# Patient Record
Sex: Female | Born: 2000 | Race: White | Hispanic: No | Marital: Single | Smoking: Never smoker
Health system: Southern US, Community
[De-identification: ages and names within clinical notes are randomized; demographics above are authoritative.]

## PROBLEM LIST (undated history)

## (undated) DIAGNOSIS — S0291XA Unspecified fracture of skull, initial encounter for closed fracture: Secondary | ICD-10-CM

## (undated) DIAGNOSIS — S060X9A Concussion with loss of consciousness of unspecified duration, initial encounter: Secondary | ICD-10-CM

## (undated) DIAGNOSIS — F419 Anxiety disorder, unspecified: Principal | ICD-10-CM

## (undated) DIAGNOSIS — F909 Attention-deficit hyperactivity disorder, unspecified type: Secondary | ICD-10-CM

## (undated) DIAGNOSIS — S62109A Fracture of unspecified carpal bone, unspecified wrist, initial encounter for closed fracture: Secondary | ICD-10-CM

## (undated) DIAGNOSIS — N390 Urinary tract infection, site not specified: Secondary | ICD-10-CM

## (undated) HISTORY — DX: Fracture of unspecified carpal bone, unspecified wrist, initial encounter for closed fracture: S62.109A

## (undated) HISTORY — DX: Attention-deficit hyperactivity disorder, unspecified type: F90.9

## (undated) HISTORY — PX: WISDOM TOOTH EXTRACTION: SHX21

## (undated) HISTORY — DX: Concussion with loss of consciousness of unspecified duration, initial encounter: S06.0X9A

## (undated) HISTORY — DX: Anxiety disorder, unspecified: F41.9

## (undated) HISTORY — DX: Unspecified fracture of skull, initial encounter for closed fracture: S02.91XA

## (undated) HISTORY — PX: INTRAUTERINE DEVICE (IUD) INSERTION: SHX5877

## (undated) HISTORY — DX: Urinary tract infection, site not specified: N39.0

---
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
## 2019-01-09 ENCOUNTER — Other Ambulatory Visit: Payer: Self-pay

## 2019-01-09 DIAGNOSIS — Z20822 Contact with and (suspected) exposure to covid-19: Secondary | ICD-10-CM

## 2019-01-10 ENCOUNTER — Ambulatory Visit: Payer: BC Managed Care – PPO | Admitting: Pediatrics

## 2019-01-10 LAB — NOVEL CORONAVIRUS, NAA: SARS-CoV-2, NAA: NOT DETECTED

## 2019-01-17 ENCOUNTER — Telehealth: Payer: Self-pay | Admitting: Obstetrics & Gynecology

## 2019-01-22 ENCOUNTER — Encounter: Payer: Self-pay | Admitting: Obstetrics & Gynecology

## 2019-01-22 ENCOUNTER — Telehealth (INDEPENDENT_AMBULATORY_CARE_PROVIDER_SITE_OTHER): Payer: BC Managed Care – PPO | Admitting: Obstetrics & Gynecology

## 2019-01-22 DIAGNOSIS — Z3009 Encounter for other general counseling and advice on contraception: Secondary | ICD-10-CM

## 2019-01-22 DIAGNOSIS — N946 Dysmenorrhea, unspecified: Secondary | ICD-10-CM

## 2019-01-22 NOTE — Progress Notes (Signed)
Virtual Visit via Video Note  I connected with Hannah Alvarado on 01/22/19 at 11:30 AM EST by a video enabled telemedicine application and verified that I am speaking with the correct person using two identifiers.  Location: Patient: home Provider: office   I discussed the limitations of evaluation and management by telemedicine and the availability of in person appointments. The patient expressed understanding and agreed to proceed.  History of Present Illness: 18 y.o. G0P0000 Single White or Caucasian female here to discuss birth control options.  She has been consider using an IUD for [place] term option.  Has three suite-mates who all have IUDs.  Has been using continuous active OCPs for cycle control and to help with dysmenorrhea.  Also considering this for contraception options.  Is anxious about the political climate and the possiblity of not having this option going forward.  Just wants to review options.  Mother was present for video conferencing but only after I spoke with pt initially and pt was comfortable with mother's presence on video call.  IUD options, risks, benefits and side effects discussed.  Placement discussed.  Decreased risks due to no estrogen reviewed.  Uterine peforation and surgical correctoin, imbedded IUD and common cramping/spotting discussed with pt.  All questions answered.    Patient Active Problem List   Diagnosis Date Noted  . Attention disturbance 06/05/2018  . Immunization due 07/28/2017  . Sleep disorder 06/07/2016  . 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 Medication management 03/23/2016  . Post concussion syndrome 10/29/2015  . Head injury 10/23/2015  . Anxiety 11/28/2014  . Panic attack 11/28/2014  . Attention deficit hyperactivity disorder (ADHD), combined type 09/20/2013  . BMI (body mass index), pediatric, 5% to less than 85% for age 37/19/2014    Past Medical History:  Diagnosis Date  . ADHD (attention deficit hyperactivity disorder)   . Anxiety  11/28/2014  . Broken wrist 10/2005   right wrist  . Concussion   . Concussion 10/23/2015  . Neonatal hyperbilirubinemia    peak bili 20.9  . Skull fracture (HCC) 10/2015  . UTI (lower urinary tract infection) 02/21/12   cystitis, E. Coli    Past Surgical History:  Procedure Laterality Date  . WISDOM TOOTH EXTRACTION      MEDS:   Current Outpatient Medications on File Prior to Visit  Medication Sig Dispense Refill  . amphetamine-dextroamphetamine (ADDERALL XR) 20 MG 24 hr capsule Take 1 capsule (20 mg total) by mouth daily for 30 days. 30 capsule 0  . amphetamine-dextroamphetamine (ADDERALL XR) 20 MG 24 hr capsule Take 1 capsule (20 mg total) by mouth daily for 30 days. 30 capsule 0  . amphetamine-dextroamphetamine (ADDERALL XR) 20 MG 24 hr capsule Take 1 capsule (20 mg total) by mouth daily. 30 capsule 0  . [START ON 01/27/2019] amphetamine-dextroamphetamine (ADDERALL XR) 20 MG 24 hr capsule Take 1 capsule (20 mg total) by mouth daily. 30 capsule 0  . [START ON 02/26/2019] amphetamine-dextroamphetamine (ADDERALL XR) 20 MG 24 hr capsule Take 1 capsule (20 mg total) by mouth daily. 30 capsule 0  . amphetamine-dextroamphetamine (ADDERALL) 10 MG tablet Take 1 tablet (10 mg total) by mouth daily for 30 days. 30 tablet 0  . amphetamine-dextroamphetamine (ADDERALL) 10 MG tablet Take 1 tablet (10 mg total) by mouth daily for 30 days. 30 tablet 0  . amphetamine-dextroamphetamine (ADDERALL) 10 MG tablet Take 1 tablet (10 mg total) by mouth daily for 30 days. 30 tablet 0  . cloNIDine (CATAPRES) 0.1 MG tablet TAKE 1 TABLET (  0.1 MG TOTAL) BY MOUTH AT BEDTIME. 30 tablet 3  . norethindrone-ethinyl estradiol-iron (LARIN FE 1.5/30) 1.5-30 MG-MCG tablet TAKE 1 TABLET BY MOUTH ONCE DAILY SKIPPING PLACEBO TABLETS. 4 Package 4   No current facility-administered medications on file prior to visit.     ALLERGIES: Gabapentin  Family History  Problem Relation Age of Onset  . Diabetes Father   . Diabetes  Maternal Grandmother   . Hypertension Maternal Grandmother   . Cancer Paternal Grandfather        leukemia  . Diabetes Paternal Grandfather   . Heart disease Paternal Grandfather        and renal disease-due to medications  . Migraines Paternal Grandfather   . Depression Mother   . ADD / ADHD Sister   . Autism Sister   . ADD / ADHD Brother     SH:  Single, non smoker   Observations/Objective: WNWD WF, NAD  Assessment and Plan: Contraception discussion H/o dysmenorrhea Considering chang from OCPs to [place] IUD.  Follow Up Instructions: She and mother will discuss further and she will let me know if desires to have IUD placement done.  For now, will stay on continuous active OCPs.    The patient knows to call back or seek an in-person evaluation if feels this is appropriate for her.    I provided 20 minutes of non-face-to-face time during this encounter.   Megan Salon, MD

## 2019-01-26 ENCOUNTER — Other Ambulatory Visit: Payer: Self-pay

## 2019-01-26 ENCOUNTER — Ambulatory Visit (INDEPENDENT_AMBULATORY_CARE_PROVIDER_SITE_OTHER): Payer: BC Managed Care – PPO | Admitting: Pediatrics

## 2019-01-26 ENCOUNTER — Encounter: Payer: Self-pay | Admitting: Pediatrics

## 2019-01-26 VITALS — BP 120/80 | Ht 65.5 in | Wt 126.2 lb

## 2019-01-26 DIAGNOSIS — Z1331 Encounter for screening for depression: Secondary | ICD-10-CM

## 2019-01-26 DIAGNOSIS — Z0000 Encounter for general adult medical examination without abnormal findings: Secondary | ICD-10-CM

## 2019-01-26 DIAGNOSIS — Z68.41 Body mass index (BMI) pediatric, 5th percentile to less than 85th percentile for age: Secondary | ICD-10-CM | POA: Diagnosis not present

## 2019-01-26 DIAGNOSIS — Z00129 Encounter for routine child health examination without abnormal findings: Secondary | ICD-10-CM

## 2019-01-26 MED ORDER — AMPHETAMINE-DEXTROAMPHET ER 20 MG PO CP24: 20.0000 mg | ORAL_CAPSULE | Freq: Every day | ORAL | 0 refills | Status: DC

## 2019-01-26 NOTE — Patient Instructions (Signed)
Preventive Care 19-19 Years Old, Female Preventive care refers to lifestyle choices and visits with your health care provider that can promote health and wellness. At this stage in your life, you may start seeing a primary care physician instead of a pediatrician. Your health care is now your responsibility. Preventive care for young adults includes:  A yearly physical exam. This is also called an annual wellness visit.  Regular dental and eye exams.  Immunizations.  Screening for certain conditions.  Healthy lifestyle choices, such as diet and exercise. What can I expect for my preventive care visit? Physical exam Your health care provider may check:  Height and weight. These may be used to calculate body mass index (BMI), which is a measurement that tells if you are at a healthy weight.  Heart rate and blood pressure.  Body temperature. Counseling Your health care provider may ask you questions about:  Past medical problems and family medical history.  Alcohol, tobacco, and drug use.  Home and relationship well-being.  Access to firearms.  Emotional well-being.  Diet, exercise, and sleep habits.  Sexual activity and sexual health.  Method of birth control.  Menstrual cycle.  Pregnancy history. What immunizations do I need?  Influenza (flu) vaccine  This is recommended every year. Tetanus, diphtheria, and pertussis (Tdap) vaccine  You may need a Td booster every 10 years. Varicella (chickenpox) vaccine  You may need this vaccine if you have not already been vaccinated. Human papillomavirus (HPV) vaccine  If recommended by your health care provider, you may need three doses over 6 months. Measles, mumps, and rubella (MMR) vaccine  You may need at least one dose of MMR. You may also need a second dose. Meningococcal conjugate (MenACWY) vaccine  One dose is recommended if you are 19-19 years old and a first-year college student living in a residence hall,  or if you have one of several medical conditions. You may also need additional booster doses. Pneumococcal conjugate (PCV13) vaccine  You may need this if you have certain conditions and were not previously vaccinated. Pneumococcal polysaccharide (PPSV23) vaccine  You may need one or two doses if you smoke cigarettes or if you have certain conditions. Hepatitis A vaccine  You may need this if you have certain conditions or if you travel or work in places where you may be exposed to hepatitis A. Hepatitis B vaccine  You may need this if you have certain conditions or if you travel or work in places where you may be exposed to hepatitis B. Haemophilus influenzae type b (Hib) vaccine  You may need this if you have certain risk factors. You may receive vaccines as individual doses or as more than one vaccine together in one shot (combination vaccines). Talk with your health care provider about the risks and benefits of combination vaccines. What tests do I need? Blood tests  Lipid and cholesterol levels. These may be checked every 5 years starting at age 19.  Hepatitis C test.  Hepatitis B test. Screening  Pelvic exam and Pap test. This may be done every 3 years starting at age 19.  Sexually transmitted disease (STD) testing, if you are at risk.  BRCA-related cancer screening. This may be done if you have a family history of breast, ovarian, tubal, or peritoneal cancers. Other tests  Tuberculosis skin test.  Vision and hearing tests.  Skin exam.  Breast exam. Follow these instructions at home: Eating and drinking   Eat a diet that includes fresh fruits and   grains, lean protein, and low-fat dairy products.  Drink enough fluid to keep your urine pale yellow.  Do not drink alcohol if: ? Your health care provider tells you not to drink. ? You are pregnant, may be pregnant, or are planning to become pregnant. ? You are under the legal drinking age. In the [place]  the legal drinking age is 45.  If you drink alcohol: ? Limit how much you have to 0-1 drink a day. ? Be aware of how much alcohol is in your drink. In the [place] one drink equals one 12 oz bottle of beer (355 mL), one 5 oz glass of wine (148 mL), or one 1 oz glass of hard liquor (44 mL). Lifestyle  Take daily care of your teeth and gums.  Stay active. Exercise at least 30 minutes 5 or more days of the week.  Do not use any products that contain nicotine or tobacco, such as cigarettes, e-cigarettes, and chewing tobacco. If you need help quitting, ask your health care provider.  Do not use drugs.  If you are sexually active, practice safe sex. Use a condom or other form of birth control (contraception) in order to prevent pregnancy and STIs (sexually transmitted infections). If you plan to become pregnant, see your health care provider for a pre-conception visit.  Find healthy ways to cope with stress, such as: ? Meditation, yoga, or listening to music. ? Journaling. ? Talking to a trusted person. ? Spending time with friends and family. Safety  Always wear your seat belt while driving or riding in a vehicle.  Do not drive if you have been drinking alcohol. Do not ride with someone who has been drinking.  Do not drive when you are tired or distracted. Do not text while driving.  Wear a helmet and other protective equipment during sports activities.  If you have firearms in your house, make sure you follow all gun safety procedures.  Seek help if you have been bullied, physically abused, or sexually abused.  Use the Internet responsibly to avoid dangers such as online bullying and online sex predators. What's next?  Go to your health care provider once a year for a well check visit.  Ask your health care provider how often you should have your eyes and teeth checked.  Stay up to date on all vaccines. This information is not intended to replace advice given to you by your  health care provider. Make sure you discuss any questions you have with your health care provider. Document Released: 07/24/2015 Document Revised: 03/02/2018 Document Reviewed: 03/02/2018 Elsevier Patient Education  2020 [employer].

## 2019-01-26 NOTE — Progress Notes (Signed)
Routine Well-Adolescent Visit   PCP: Leveda Anna, NP   History was provided by the patient and mother.  Hannah Alvarado is a 18 y.o. female who is here for 18 year well check.   Current concerns: restarted having sleeping issues, will wake up several times at night.    Adolescent Assessment:  Confidentiality was discussed with the patient and if applicable, with caregiver as well.  Home and Environment:  Lives with: lives at home with parents Parental relations: good Friends/Peers: yes Nutrition/Eating Behaviors: balanced, meats, fruits, vegetables, milk/calcium Sports/Exercise:  none  Education and Employment:  School Status: in college in online and on campus classes and is doing very well School History: School attendance is regular. 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 Yes  Smoking: no Secondhand smoke exposure? no Drugs/EtOH: none   Sexuality:  -Menarche: currently on OCP for period regulation - females:  last menses: N/A- constant cycling on OCP - Menstrual History: with severe dysmenorrhea  - Sexually active? no  - sexual partners in last year: No immediate complications noted. - contraception use: abstinence - Last STI Screening: NA  - Violence/Abuse: none  Mood: Suicidality and Depression: denies Weapons: none  Screenings: The patient completed the Rapid Assessment for Adolescent Preventive Services screening questionnaire and the following topics were identified as risk factors and discussed: none  In addition, the following topics were discussed as part of anticipatory guidance healthy eating, exercise, seatbelt use, bullying, abuse/trauma, weapon use, tobacco use, marijuana use, drug use, condom use, birth control, suicidality/self harm, mental health issues and social isolation.  PHQ-9  completed and results indicated no concerns  Physical Exam:  BP 120/80   Ht 5' 5.5" (1.664 m)   Wt 126 lb 3.2 oz (57.2 kg)   BMI 20.68 kg/m  Blood pressure percentiles are not available for patients who are 18 years or older.  General Appearance:   alert, oriented, no acute distress and well nourished  HENT: Normocephalic, no obvious abnormality, PERRL, EOM's intact, conjunctiva clear  Mouth:   Normal appearing teeth, no obvious discoloration, dental caries, or dental caps  Neck:   Supple; thyroid: no enlargement, symmetric, no tenderness/mass/nodules  Lungs:   Clear to auscultation bilaterally, normal work of breathing  Heart:   Regular rate and rhythm, S1 and S2 normal, no murmurs;   Abdomen:   Soft, non-tender, no mass, or organomegaly  GU genitalia not examined  Musculoskeletal:   Tone and strength strong and symmetrical, all extremities               Lymphatic:   No cervical adenopathy  Skin/Hair/Nails:   Skin warm, dry and intact, no rashes, no bruises or petechiae  Neurologic:   Strength, gait, and coordination normal and age-appropriate    Assessment/Plan:  BMI: is appropriate for age  Immunizations today: per orders. History of previous adverse reactions to immunizations? no Counseling completed for all of the vaccine components. No orders of the defined types were placed in this encounter.   Darrell Jewel, NP

## 2019-03-19 ENCOUNTER — Telehealth: Payer: Self-pay | Admitting: Obstetrics & Gynecology

## 2019-03-19 DIAGNOSIS — Z3009 Encounter for other general counseling and advice on contraception: Secondary | ICD-10-CM

## 2019-03-19 MED FILL — LARIN FE 1.5-30 TABLET: 1.5-30 | 84 days supply | Qty: 112 | Fill #2

## 2019-03-19 NOTE — Telephone Encounter (Signed)
Spoke to pt. Pt wanting to get [facility] IUD insertion since talking with Dr Sabra Heck on [facility] visit on 01/22/19. Pt requested OV before Jan 14th before moving back to college. Pt scheduled for IUD insertion on 03/29/2019 at 8am. Pt agreeable. Pt instructed to take Motrin 800 mg with food and water one hour before procedure and to continue to take OCPs to appt. Pt verbalized understanding.   Will route to Dr Sabra Heck for review and will close encounter.   CC: Weston Brass for precert. Orders placed.

## 2019-03-19 NOTE — Telephone Encounter (Signed)
Patient calling to schedule [name] IUD insertion.

## 2019-03-20 ENCOUNTER — Telehealth: Payer: Self-pay | Admitting: Obstetrics & Gynecology

## 2019-03-20 NOTE — Telephone Encounter (Signed)
Call placed to convey benefits for [name]. 

## 2019-03-20 NOTE — Telephone Encounter (Signed)
Spoke with the patient and conveyed benefits for Hannah Alvarado. Patient understands/agreeable with the benefits. Patient is aware of the cancellation policy. Appointment scheduled 03/29/19.

## 2019-03-23 DIAGNOSIS — Z975 Presence of (intrauterine) contraceptive device: Secondary | ICD-10-CM | POA: Insufficient documentation

## 2019-03-23 HISTORY — DX: Presence of (intrauterine) contraceptive device: Z97.5

## 2019-03-26 ENCOUNTER — Other Ambulatory Visit: Payer: Self-pay

## 2019-03-27 ENCOUNTER — Encounter: Payer: Self-pay | Admitting: Family Medicine

## 2019-03-27 ENCOUNTER — Ambulatory Visit: Payer: BC Managed Care – PPO | Admitting: Family Medicine

## 2019-03-27 VITALS — BP 109/74 | HR 109 | Temp 98.6°F | Resp 16 | Ht 65.25 in | Wt 126.4 lb

## 2019-03-27 DIAGNOSIS — F419 Anxiety disorder, unspecified: Secondary | ICD-10-CM | POA: Diagnosis not present

## 2019-03-27 DIAGNOSIS — Z7689 Persons encountering health services in other specified circumstances: Secondary | ICD-10-CM

## 2019-03-27 DIAGNOSIS — G479 Sleep disorder, unspecified: Secondary | ICD-10-CM | POA: Diagnosis not present

## 2019-03-27 DIAGNOSIS — F902 Attention-deficit hyperactivity disorder, combined type: Secondary | ICD-10-CM | POA: Diagnosis not present

## 2019-03-27 MED ORDER — AMPHETAMINE-DEXTROAMPHET ER 20 MG PO CP24: 20.0000 mg | ORAL_CAPSULE | Freq: Every day | ORAL | 0 refills | Status: DC

## 2019-03-27 NOTE — Patient Instructions (Addendum)
I have filled your medications for you.  Follow up in 3 months.    Please help [place] help you:  We are honored you have chosen [facility] for your Primary Care home. Below you will find basic instructions that you may need to access in the future. Please help [place] help you by reading the instructions, which cover many of the frequent questions we experience.   Prescription refills and request:  -In order to allow more efficient response time, please call your pharmacy for all refills. They will forward the request electronically to [place]. This allows for the quickest possible response. Request left on a nurse line can take longer to refill, since these are checked as time allows between office patients and other phone calls.  - refill request can take up to 3-5 working days to complete.  - If request is sent electronically and request is appropiate, it is usually completed in 1-2 business days.  - all patients will need to be seen routinely for all chronic medical conditions requiring prescription medications (see follow-up below). If you are overdue for follow up on your condition, you will be asked to make an appointment and we will call in enough medication to cover you until your appointment (up to 30 days).  - all controlled substances will require a face to face visit to request/refill.  - if you desire your prescriptions to go through a new pharmacy, and have an active script at original pharmacy, you will need to call your pharmacy and have scripts transferred to new pharmacy. This is completed between the pharmacy locations and not by your provider.    Results: If any images or labs were ordered, it can take up to 1 week to get results depending on the test ordered and the lab/facility running and resulting the test. - Normal or stable results, which do not need further discussion, may be released to your mychart immediately with attached note to you. A call may not be generated for normal  results. Please make certain to sign up for mychart. If you have questions on how to activate your mychart you can call the front office.  - If your results need further discussion, our office will attempt to contact you via phone, and if unable to reach you after 2 attempts, we will release your abnormal result to your mychart with instructions.  - All results will be automatically released in mychart after 1 week.  - Your provider will provide you with explanation and instruction on all relevant material in your results. Please keep in mind, results and labs may appear confusing or abnormal to the untrained eye, but it does not mean they are actually abnormal for you personally. If you have any questions about your results that are not covered, or you desire more detailed explanation than what was provided, you should make an appointment with your provider to do so.   Our office handles many outgoing and incoming calls daily. If we have not contacted you within 1 week about your results, please check your mychart to see if there is a message first and if not, then contact our office.  In helping with this matter, you help decrease call volume, and therefore allow [place] to be able to respond to patients needs more efficiently.   Acute office visits (sick visit):  An acute visit is intended for a new problem and are scheduled in shorter time slots to allow schedule openings for patients with new problems.  This is the appropriate visit to discuss a new problem. Problems will not be addressed by phone call or Echart message. Appointment is needed if requesting treatment. In order to provide you with excellent quality medical care with proper time for you to explain your problem, have an exam and receive treatment with instructions, these appointments should be limited to one new problem per visit. If you experience a new problem, in which you desire to be addressed, please make an acute office visit, we save  openings on the schedule to accommodate you. Please do not save your new problem for any other type of visit, let us take care of it properly and quickly for you.   Follow up visits:  Depending on your condition(s) your provider will need to see you routinely in order to provide you with quality care and prescribe medication(s). Most chronic conditions (Example: hypertension, Diabetes, depression/anxiety... etc), require visits a couple times a year. Your provider will instruct you on proper follow up for your personal medical conditions and history. Please make certain to make follow up appointments for your condition as instructed. Failing to do so could result in lapse in your medication treatment/refills. If you request a refill, and are overdue to be seen on a condition, we will always provide you with a 30 day script (once) to allow you time to schedule.    Medicare wellness (well visit): - we have a wonderful Nurse Maudie Mercury), that will meet with you and provide you will yearly medicare wellness visits. These visits should occur yearly (can not be scheduled less than 1 calendar year apart) and cover preventive health, immunizations, advance directives and screenings you are entitled to yearly through your medicare benefits. Do not miss out on your entitled benefits, this is when medicare will pay for these benefits to be ordered for you.  These are strongly encouraged by your provider and is the appropriate type of visit to make certain you are up to date with all preventive health benefits. If you have not had your medicare wellness exam in the last 12 months, please make certain to schedule one by calling the office and schedule your medicare wellness with Maudie Mercury as soon as possible.   Yearly physical (well visit):  - Adults are recommended to be seen yearly for physicals. Check with your insurance and date of your last physical, most insurances require one calendar year between physicals. Physicals  include all preventive health topics, screenings, medical exam and labs that are appropriate for gender/age and history. You may have fasting labs needed at this visit. This is a well visit (not a sick visit), new problems should not be covered during this visit (see acute visit).  - Pediatric patients are seen more frequently when they are younger. Your provider will advise you on well child visit timing that is appropriate for your their age. - This is not a medicare wellness visit. Medicare wellness exams do not have an exam portion to the visit. Some medicare companies allow for a physical, some do not allow a yearly physical. If your medicare allows a yearly physical you can schedule the medicare wellness with our nurse Maudie Mercury and have your physical with your provider after, on the same day. Please check with insurance for your full benefits.   Late Policy/No Shows:  - all new patients should arrive 15-30 minutes earlier than appointment to allow [place] time  to  obtain all personal demographics,  insurance information and for you to complete office  paperwork. - All established patients should arrive 10-15 minutes earlier than appointment time to update all information and be checked in .  - In our best efforts to run on time, if you are late for your appointment you will be asked to either reschedule or if able, we will work you back into the schedule. There will be a wait time to work you back in the schedule,  depending on availability.  - If you are unable to make it to your appointment as scheduled, please call 24 hours ahead of time to allow [place] to fill the time slot with someone else who needs to be seen. If you do not cancel your appointment ahead of time, you may be charged a no show fee.
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
## 2019-03-29 ENCOUNTER — Ambulatory Visit (INDEPENDENT_AMBULATORY_CARE_PROVIDER_SITE_OTHER): Payer: BC Managed Care – PPO | Admitting: Obstetrics & Gynecology

## 2019-03-29 ENCOUNTER — Encounter: Payer: Self-pay | Admitting: Obstetrics & Gynecology

## 2019-03-29 ENCOUNTER — Other Ambulatory Visit: Payer: Self-pay

## 2019-03-29 VITALS — BP 110/58 | HR 88 | Temp 98.1°F | Resp 12 | Wt 128.0 lb

## 2019-03-29 DIAGNOSIS — Z01812 Encounter for preprocedural laboratory examination: Secondary | ICD-10-CM | POA: Diagnosis not present

## 2019-03-29 DIAGNOSIS — Z3009 Encounter for other general counseling and advice on contraception: Secondary | ICD-10-CM

## 2019-03-29 DIAGNOSIS — Z30430 Encounter for insertion of intrauterine contraceptive device: Secondary | ICD-10-CM

## 2019-03-29 LAB — POCT URINE PREGNANCY: Preg Test, Ur: NEGATIVE

## 2019-03-29 NOTE — Patient Instructions (Signed)
IUD Post-procedure Instructions . Cramping is common.  You may take Ibuprofen, Aleve, or Tylenol for the cramping.  A good dosage is 400mg  of ibuprofen and 500mg  tylenol together and take this up to every 4 hours as needed.  This typically resolves within 48 hours.   . You may have a small amount of spotting.  You should wear a mini pad for the next few days. . You may have intercourse in 24 hours. . You need to call the office if you have any pelvic pain, fever, heavy bleeding, or foul smelling vaginal discharge. . Shower or bathe as normal
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
## 2019-03-30 LAB — PAIN MGMT, PROFILE 8 W/CONF, U
6 Acetylmorphine: NEGATIVE ng/mL
Alcohol Metabolites: POSITIVE ng/mL — AB (ref <500)
Amphetamine: 1680 ng/mL
Amphetamines: POSITIVE ng/mL
Benzodiazepines: NEGATIVE ng/mL
Buprenorphine, Urine: NEGATIVE ng/mL
Cocaine Metabolite: NEGATIVE ng/mL
Creatinine: 109.1 mg/dL
Ethyl Glucuronide (ETG): 5554 ng/mL
Ethyl Sulfate (ETS): 379 ng/mL
MDMA: NEGATIVE ng/mL
Marijuana Metabolite: NEGATIVE ng/mL
Methamphetamine: NEGATIVE ng/mL
Opiates: NEGATIVE ng/mL
Oxidant: NEGATIVE ug/mL
Oxycodone: NEGATIVE ng/mL
pH: 6.8 (ref 4.5–9.0)

## 2019-04-02 ENCOUNTER — Encounter: Payer: Self-pay | Admitting: Family Medicine

## 2019-04-25 ENCOUNTER — Other Ambulatory Visit (INDEPENDENT_AMBULATORY_CARE_PROVIDER_SITE_OTHER): Payer: Self-pay | Admitting: Pediatrics

## 2019-05-17 ENCOUNTER — Other Ambulatory Visit (INDEPENDENT_AMBULATORY_CARE_PROVIDER_SITE_OTHER): Payer: Self-pay | Admitting: Pediatrics

## 2019-05-25 ENCOUNTER — Other Ambulatory Visit: Payer: Self-pay

## 2019-05-25 ENCOUNTER — Telehealth (INDEPENDENT_AMBULATORY_CARE_PROVIDER_SITE_OTHER): Payer: BC Managed Care – PPO | Admitting: Pediatrics

## 2019-05-25 ENCOUNTER — Encounter (INDEPENDENT_AMBULATORY_CARE_PROVIDER_SITE_OTHER): Payer: Self-pay | Admitting: Pediatrics

## 2019-05-25 VITALS — Ht 65.0 in | Wt 126.0 lb

## 2019-05-25 DIAGNOSIS — G479 Sleep disorder, unspecified: Secondary | ICD-10-CM | POA: Diagnosis not present

## 2019-05-25 MED ORDER — CLONIDINE HCL 0.1 MG PO TABS: 0.2000 mg | ORAL_TABLET | Freq: Every day | ORAL | 2 refills | Status: DC

## 2019-05-25 NOTE — Patient Instructions (Signed)
Sleep Tips for Adolescents  The following recommendations will help you get the best sleep possible and make it easier for you to fall asleep and stay asleep:  Sleep schedule. Wake up and go to bed at about the same time on school nights and non-school nights. Bedtime and wake time should not differ from one day to the next by more than an hour or so. Weekends. Don't sleep in on weekends to "catch up" on sleep. This makes it more likely that you will have problems falling asleep at bedtime.  Naps. If you are very sleepy during the day, nap for 30 to 45 minutes in the early afternoon. Don't nap too [place] or too late in the afternoon or you will have difficulty falling asleep at bedtime.  Sunlight. Spend time outside every day, especially in the morning, as exposure to sunlight, or bright light, helps to keep your body's internal clock on track.  Exercise. Exercise regularly. Exercising may help you fall asleep and sleep more deeply.  Bedroom. Make sure your bedroom is comfortable, quiet, and dark. Make sure also that it is not too warm at night, as sleeping in a room warmer than 75P will make it hard to sleep.  Bed. Use your bed only for sleeping. Don't study, read, or listen to music on your bed.  Bedtime. Make the 30 to 60 minutes before bedtime a quiet or wind-down time. Relaxing, calm, enjoyable activities, such as reading a book or listening to soothing music, help your body and mind slow down enough to let you sleep. Do not watch TV, study, exercise, or get involved in "energizing" activities in the 30 minutes before bedtime. Snack. Eat regular meals and don't go to bed hungry. A light snack before bed is a good idea; eating a full meal in the hour before bed is not.  Caffeine. A void eating or drinking products containing caffeine in the late afternoon and evening. These include caffeinated sodas, coffee, tea, and chocolate.  Alcohol. Ingestion of alcohol disrupts sleep and may cause you to  awaken throughout the night.  Smoking. Smoking disturbs sleep. Don't smoke for at least an hour before bedtime (and preferably, not at all).  Sleeping pills. Don't use sleeping pills, melatonin, or other over-the-counter sleep aids. These may be dangerous, and your sleep problems will probably return when you stop using the medicine.   Mindell JA & Owens JA (2003). A Clinical Guide to Pediatric Sleep: Diagnosis and Management of Sleep Problems. [place]: [employer].   Supported by an educational grant from [employer]  

## 2019-05-25 NOTE — Progress Notes (Signed)
Patient: Hannah Alvarado MRN: [id number] Sex: female DOB: 01-13-2001  Provider: Carylon Perches, MD  This is a Pediatric Specialist E-Visit follow up consult provided via WebEx.  Staci Acosta Dolinsky consented to an E-Visit consult today.  Location of patient: Hannah Alvarado is at home Location of provider: Marden Noble is at office Patient was referred by Ma Hillock, DO   The following participants were involved in this E-Visit: Sabino Niemann, CMA      Carylon Perches, MD  Chief Complain/ Reason for E-Visit today: Routine Follow-Up  History of Present Illness:  Hannah Alvarado is a 19 y.o. female with history of sleep disorder who I am seeing for routine follow-up. Patient was last seen on 09/08/2018  Where clonidine was continued.   Patient presents today by herself.  She reports sleep is going well.  She requests more clonidine.  She took some time to adjust to college.  She falls asleep well, but sometimes wakes up and has to take another dose.   Goes to bed at 11pm-12am, wakes at 8:30-10am.  Weekends are pretty consistent.  Wakes up about 2-3 times per night, falls asleep quickly.  However reports tired during the day.    Endorses college has been stressful.  She is taking adderall during the day 20mg  without problem.     Past Medical History Past Medical History:  Diagnosis Date  . ADHD (attention deficit hyperactivity disorder)   . Anxiety 11/28/2014  . Broken wrist 10/2005   right wrist  . Concussion 10/23/2015  . IUD (intrauterine device) in place 03/2019  . Neonatal hyperbilirubinemia    peak bili 20.9  . Skull fracture ([place]) 10/2015  . UTI (lower urinary tract infection) 02/21/12   cystitis, E. Coli    Surgical History Past Surgical History:  Procedure Laterality Date  . WISDOM TOOTH EXTRACTION      Family History family history includes ADD / ADHD in her brother and sister; Autism in her sister; Breast cancer in her paternal grandmother;  Cancer in her paternal grandfather; Depression in her mother and sister; Diabetes in her father, maternal grandmother, and paternal grandfather; Heart disease in her paternal grandfather; Hypertension in her father and maternal grandmother; Learning disabilities in her brother; Migraines in her paternal grandfather; Miscarriages / [place] in her maternal grandmother and paternal grandmother; Throat cancer in her father.   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  Otherwise lives with parents.  Has a brother and a sister.   youth group at church.   Safety:      -Wears a bicycle helmet riding a bike: Yes     -smoke alarm in the home:Yes     - wears seatbelt: Yes     - Feels safe in their relationships: Yes      Rivermead (06/07/2016): 8   Rivermead (08/09/2016): 13   Rivermead (02/02/2017): 7             Allergies Allergies  Allergen Reactions  . Gabapentin Rash    Medications Current Outpatient Medications on File Prior to Visit  Medication Sig Dispense Refill  . Levonorgestrel 19.5 MG IUD by Intrauterine route.     No current facility-administered medications on file prior to visit.   The medication list was reviewed and reconciled. All changes or newly prescribed medications were explained.  A complete medication list was provided to the patient/caregiver.  Physical Exam Vitals deferred due to  webex visit Gen: well appearing teen Skin: No rash, No neurocutaneous stigmata. HEENT: Normocephalic, no dysmorphic features, no conjunctival injection, nares patent, mucous membranes moist, oropharynx clear. Resp: normal work of breathing OE:CXFQHKU well perfused  Neurological Examination: MS: Awake, alert, interactive. Normal eye contact, answered the questions appropriately for age, speech was fluent,  Normal comprehension.  Attention and concentration were normal. Cranial Nerves: EOM normal,  no nystagmus; no ptsosis, face symmetric with full strength of facial muscles, hearing grossly intact.  Motor/Coordination- At least antigravity in upper extremities.. No abnormal movements.   Diagnosis:  1. Sleep disorder       Assessment and Plan Hannah Alvarado is a 19 y.o. female with history of sleep disorder who I am seeing in follow-up. Patient doing well falling asleep, but having trouble with frequent arousals.  Is having increased anxiety which may contribute to sleep difficulty, so discussed seeking counseling with college health.  However will increase clonidine given good repsonse previously.    Refill Clonidine, increase to 2 tablets nightly for now.  May reduce back to 1 tablet if sleep improves, however counseled on not stopping abruptly.   Sleep hygeine tips provided.    Return in about 4 months (around 09/24/2019).  Lorenz Coaster MD MPH Neurology and Neurodevelopment [facility]  [address] [place], [place], [place] [address] Phone: [phone]   Total time on call: 12 minutes

## 2019-06-18 ENCOUNTER — Ambulatory Visit (INDEPENDENT_AMBULATORY_CARE_PROVIDER_SITE_OTHER): Payer: BC Managed Care – PPO | Admitting: Family Medicine

## 2019-06-18 ENCOUNTER — Encounter: Payer: Self-pay | Admitting: Family Medicine

## 2019-06-18 ENCOUNTER — Other Ambulatory Visit: Payer: Self-pay

## 2019-06-18 VITALS — Temp 97.7°F | Ht 65.25 in

## 2019-06-18 DIAGNOSIS — F902 Attention-deficit hyperactivity disorder, combined type: Secondary | ICD-10-CM | POA: Diagnosis not present

## 2019-06-18 DIAGNOSIS — F419 Anxiety disorder, unspecified: Secondary | ICD-10-CM

## 2019-06-18 DIAGNOSIS — G479 Sleep disorder, unspecified: Secondary | ICD-10-CM | POA: Diagnosis not present

## 2019-06-18 MED ORDER — AMPHETAMINE-DEXTROAMPHET ER 20 MG PO CP24: 20.0000 mg | ORAL_CAPSULE | Freq: Every day | ORAL | 0 refills | Status: DC
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
## 2019-10-23 ENCOUNTER — Ambulatory Visit: Payer: BC Managed Care – PPO | Admitting: Obstetrics & Gynecology
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
## 2019-11-05 ENCOUNTER — Encounter: Payer: Self-pay | Admitting: Obstetrics & Gynecology

## 2019-11-05 ENCOUNTER — Other Ambulatory Visit: Payer: Self-pay

## 2019-11-05 ENCOUNTER — Ambulatory Visit: Payer: BC Managed Care – PPO | Admitting: Obstetrics & Gynecology

## 2019-11-05 VITALS — BP 108/70 | HR 68 | Resp 16 | Ht 65.5 in | Wt 141.0 lb

## 2019-11-05 DIAGNOSIS — Z01419 Encounter for gynecological examination (general) (routine) without abnormal findings: Secondary | ICD-10-CM

## 2020-02-11 ENCOUNTER — Other Ambulatory Visit (INDEPENDENT_AMBULATORY_CARE_PROVIDER_SITE_OTHER): Payer: Self-pay | Admitting: Pediatrics

## 2020-11-12 DIAGNOSIS — Z20822 Contact with and (suspected) exposure to covid-19: Secondary | ICD-10-CM | POA: Diagnosis not present

## 2021-02-09 ENCOUNTER — Other Ambulatory Visit (HOSPITAL_COMMUNITY)
Admission: RE | Admit: 2021-02-09 | Discharge: 2021-02-09 | Disposition: A | Discharge status: Home / Self Care | Payer: BC Managed Care – PPO | Source: Ambulatory Visit | Attending: Obstetrics & Gynecology | Admitting: Obstetrics & Gynecology

## 2021-02-09 ENCOUNTER — Encounter (HOSPITAL_BASED_OUTPATIENT_CLINIC_OR_DEPARTMENT_OTHER): Payer: Self-pay | Admitting: Obstetrics & Gynecology

## 2021-02-09 ENCOUNTER — Other Ambulatory Visit: Payer: Self-pay

## 2021-02-09 ENCOUNTER — Ambulatory Visit (INDEPENDENT_AMBULATORY_CARE_PROVIDER_SITE_OTHER): Payer: BC Managed Care – PPO | Admitting: Obstetrics & Gynecology

## 2021-02-09 VITALS — BP 114/72 | HR 85 | Ht 65.0 in | Wt 166.2 lb

## 2021-02-09 DIAGNOSIS — Z124 Encounter for screening for malignant neoplasm of cervix: Secondary | ICD-10-CM

## 2021-02-09 DIAGNOSIS — Z975 Presence of (intrauterine) contraceptive device: Secondary | ICD-10-CM | POA: Diagnosis not present

## 2021-02-09 DIAGNOSIS — F0781 Postconcussional syndrome: Secondary | ICD-10-CM

## 2021-02-09 DIAGNOSIS — Z01419 Encounter for gynecological examination (general) (routine) without abnormal findings: Secondary | ICD-10-CM | POA: Diagnosis not present

## 2021-02-09 DIAGNOSIS — F902 Attention-deficit hyperactivity disorder, combined type: Secondary | ICD-10-CM

## 2021-02-09 NOTE — Progress Notes (Signed)
20 y.o. G0P0000 Single White or Caucasian female here for annual exam.  Doing well.  Graduating next month from [employer] with degree in communications.  Not SA.  Has Kyleen IUD in 2021.  Cycles are typically monthly.  This past month it was just spotting but that is unusual.  Rutha Bouchard was placed 03/2019.    No LMP recorded. (Menstrual status: IUD).          Sexually active: No.  The current method of family planning is IUD.    Exercising: Yes.     running Smoker:  no  Health Maintenance: Pap:  today History of abnormal Pap:  n/a MMG:  guidelines reviewed Colonoscopy:  guidelines reviewed Screening Labs: not indicated   reports that she has never smoked. She has never used smokeless tobacco. She reports that she does not drink alcohol and does not use drugs.  Past Medical History:  Diagnosis Date   ADHD (attention deficit hyperactivity disorder)    Anxiety 11/28/2014   Broken wrist 10/2005   right wrist   Concussion 10/23/2015   IUD (intrauterine device) in place 03/2019   Neonatal hyperbilirubinemia    peak bili 20.9   Skull fracture (HCC) 10/2015   UTI (lower urinary tract infection) 02/21/12   cystitis, E. Coli    Past Surgical History:  Procedure Laterality Date   INTRAUTERINE DEVICE (IUD) INSERTION     kyleena inserted 03-29-2019   WISDOM TOOTH EXTRACTION      Current Outpatient Medications  Medication Sig Dispense Refill   levonorgestrel ([name]) 19.5 MG IUD by Intrauterine route once.     cloNIDine (CATAPRES) 0.1 MG tablet TAKE 2 TABLETS (0.2 MG TOTAL) BY MOUTH AT BEDTIME. (Patient not taking: Reported on 02/09/2021) 180 tablet 2   No current facility-administered medications for this visit.    Family History  Problem Relation Age of Onset   Diabetes Father    Throat cancer Father    Hypertension Father    Diabetes Maternal Grandmother    Hypertension Maternal Grandmother    Miscarriages / Stillbirths Maternal Grandmother    Cancer Paternal Grandfather         leukemia   Diabetes Paternal Grandfather    Heart disease Paternal Grandfather        and renal disease-due to medications   Migraines Paternal Grandfather    Depression Mother    ADD / ADHD Sister    Autism Sister    Depression Sister    ADD / ADHD Brother    Learning disabilities Brother    Breast cancer Paternal Grandmother    Miscarriages / Stillbirths Paternal Grandmother     Review of Systems  All other systems reviewed and are negative.  Exam:   BP 114/72 (BP Location: Left Arm, Patient Position: Sitting, Cuff Size: Normal)   Pulse 85   Ht 5\' 5"  (1.651 m) Comment: reported  Wt 166 lb 3.2 oz (75.4 kg)   BMI 27.66 kg/m   Height: 5\' 5"  (165.1 cm) (reported)  General appearance: alert, cooperative and appears stated age Head: Normocephalic, without obvious abnormality, atraumatic Neck: no adenopathy, supple, symmetrical, trachea midline and thyroid normal to inspection and palpation Lungs: clear to auscultation bilaterally Breasts: normal appearance, no masses or tenderness Heart: regular rate and rhythm Abdomen: soft, non-tender; bowel sounds normal; no masses,  no organomegaly Extremities: extremities normal, atraumatic, no cyanosis or edema Skin: Skin color, texture, turgor normal. No rashes or lesions Lymph nodes: Cervical, supraclavicular, and axillary nodes normal. No abnormal inguinal nodes  palpated Neurologic: Grossly normal   Pelvic: External genitalia:  no lesions              Urethra:  normal appearing urethra with no masses, tenderness or lesions              Bartholins and Skenes: normal                 Vagina: normal appearing vagina with normal color and no discharge, no lesions              Cervix: no lesions, IUD string noted              Pap taken: Yes.   Bimanual Exam:  Uterus:  normal size, contour, position, consistency, mobility, non-tender              Adnexa: normal adnexa               Rectovaginal: Confirms               Anus:  normal  sphincter tone, no lesions  Chaperone, Ina Homes, CMA, was present for exam.  Assessment/Plan: 1. Well woman exam with routine gynecological exam - pap smear obtained today - breast and colon cancer screening guidelines reviewed - no lab work obtained - care gaps reviewed/updated  2. IUD (intrauterine device) in place - [name] placed 03/2019  3. Attention deficit hyperactivity disorder (ADHD), combined type  4. Post concussion syndrome

## 2021-02-16 LAB — CYTOLOGY - PAP: Diagnosis: NEGATIVE

## 2021-09-25 ENCOUNTER — Encounter: Payer: BC Managed Care – PPO | Admitting: Family Medicine

## 2021-09-30 ENCOUNTER — Encounter: Payer: Self-pay | Admitting: Family Medicine

## 2021-09-30 ENCOUNTER — Ambulatory Visit (INDEPENDENT_AMBULATORY_CARE_PROVIDER_SITE_OTHER): Payer: 59 | Admitting: Family Medicine

## 2021-09-30 VITALS — BP 108/68 | HR 83 | Temp 98.2°F | Ht 65.5 in | Wt 171.0 lb

## 2021-09-30 DIAGNOSIS — Z1159 Encounter for screening for other viral diseases: Secondary | ICD-10-CM

## 2021-09-30 DIAGNOSIS — Z793 Long term (current) use of hormonal contraceptives: Secondary | ICD-10-CM

## 2021-09-30 DIAGNOSIS — Z0000 Encounter for general adult medical examination without abnormal findings: Secondary | ICD-10-CM

## 2021-09-30 DIAGNOSIS — F902 Attention-deficit hyperactivity disorder, combined type: Secondary | ICD-10-CM

## 2021-09-30 DIAGNOSIS — Z975 Presence of (intrauterine) contraceptive device: Secondary | ICD-10-CM

## 2021-09-30 LAB — CBC
HCT: 44.1 % (ref 36.0–46.0)
Hemoglobin: 14.6 g/dL (ref 12.0–15.0)
MCHC: 33.2 g/dL (ref 30.0–36.0)
MCV: 91 fl (ref 78.0–100.0)
Platelets: 339 10*3/uL (ref 150.0–400.0)
RBC: 4.84 Mil/uL (ref 3.87–5.11)
RDW: 12.8 % (ref 11.5–15.5)
WBC: 12.3 10*3/uL — ABNORMAL HIGH (ref 4.0–10.5)

## 2021-09-30 LAB — COMPREHENSIVE METABOLIC PANEL
ALT: 12 U/L (ref 0–35)
AST: 13 U/L (ref 0–37)
Albumin: 4.8 g/dL (ref 3.5–5.2)
Alkaline Phosphatase: 102 U/L (ref 39–117)
BUN: 11 mg/dL (ref 6–23)
CO2: 26 mEq/L (ref 19–32)
Calcium: 10 mg/dL (ref 8.4–10.5)
Chloride: 103 mEq/L (ref 96–112)
Creatinine, Ser: 0.69 mg/dL (ref 0.40–1.20)
GFR: 123.71 mL/min (ref >60.00)
Glucose, Bld: 75 mg/dL (ref 70–99)
Potassium: 4.2 mEq/L (ref 3.5–5.1)
Sodium: 139 mEq/L (ref 135–145)
Total Bilirubin: 0.5 mg/dL (ref 0.2–1.2)
Total Protein: 7.3 g/dL (ref 6.0–8.3)

## 2021-09-30 LAB — LDL CHOLESTEROL, DIRECT: Direct LDL: 100 mg/dL

## 2021-09-30 MED ORDER — AMPHETAMINE-DEXTROAMPHETAMINE 20 MG PO TABS: 10.0000 mg | ORAL_TABLET | Freq: Two times a day (BID) | ORAL | 0 refills | Status: DC

## 2021-09-30 NOTE — Progress Notes (Signed)
Patient ID: Hannah Alvarado, female  DOB: 17-Jan-2001, 21 y.o.   MRN: [id number] Patient Care Team    Relationship Specialty Notifications Start End  Ma Hillock, DO PCP - General Family Medicine  03/27/19 10/08/21   Reason for change: Patient Moved  Rocky Link, MD Consulting Physician Pediatric Neurology  02/02/17   Megan Salon, MD Consulting Physician Gynecology  04/02/19     Chief Complaint  Patient presents with   Annual Exam    Pt is fasting    Subjective: Hannah Alvarado is a 21 y.o.  Female  present for CPE  All past medical history, surgical history, allergies, family history, immunizations, medications and social history were updated in the electronic medical record today. All recent labs, ED visits and hospitalizations within the last year were reviewed.  Health maintenance:  Colonoscopy: No family history.  Routine screening at 74 completed  Mammogram: MGM with history of breast cancer.  Uncertain age of onset.  Would encourage her to discuss with her gynecologist if she should follow routine breast cancer screenings, dependent upon her grandmother's history. Likely - routine screening.  Cervical cancer screening: Patient has [name] IUD.  Pap completed 02/09/2021 by Dr. Ninfa Meeker Immunizations: tdap UTD 2019, Influenza UTD (encouraged yearly), patient has had COVID vaccines.  Discussed by valent booster. Infectious disease screening: HIV declined Hep C completed (tattoo), urine cytology/STD screen declined. Not SA DEXA: Routine screening Assistive device: None Oxygen use: None Patient has a Dental home. Hospitalizations/ED visits: Reviewed       09/30/2021    1:07 PM 02/09/2021    9:42 AM 03/27/2019   10:33 AM 01/26/2019    4:34 PM 12/27/2017   12:33 PM  Depression screen PHQ 2/9  Decreased Interest 0 0 0 0 0  Down, Depressed, Hopeless 0 0 0 0 0  PHQ - 2 Score 0 0 0 0 0  Altered sleeping    1 0  Tired, decreased energy    0 0  Change in  appetite    0 0  Feeling bad or failure about yourself     0 0  Trouble concentrating    1 0  Moving slowly or fidgety/restless    0 0  Suicidal thoughts    0 0  PHQ-9 Score    2 0  Difficult doing work/chores    Not difficult at all Not difficult at all       No data to display                 Immunization History  Administered Date(s) Administered   DTaP 02/10/2000, 04/14/2000, 06/09/2000, 03/03/2001, 11/03/2004   Fluad Quad(high Dose 65+) 11/23/2018   HPV Quadrivalent 12/08/2012, 02/08/2013, 12/04/2013   Hepatitis A 11/03/2004, 12/14/2005   Hepatitis B 1999-12-15, 02/10/2000, 09/15/2000   HiB (PRP-OMP) 02/10/2000, 04/14/2000, 06/09/2000, 03/03/2001   IPV 02/10/2000, 04/14/2000, 09/15/2000, 11/03/2004   Influenza Nasal 12/13/2007, 11/19/2008, 12/23/2009, 12/21/2010, 11/24/2011   Influenza,Quad,Nasal, Live 12/08/2012   Influenza,inj,Quad PF,6+ Mos 11/25/2015, 11/27/2015, 12/28/2016, 12/27/2017, 11/23/2018, 12/12/2019   Influenza,inj,quad, With Preservative 01/02/2014, 12/06/2014   Influenza-Unspecified 01/26/2021   MMR 12/09/2000, 11/03/2004   Meningococcal B, OMV 07/28/2017, 08/30/2017   Meningococcal Conjugate 12/21/2010, 12/26/2015   PFIZER(Purple Top)SARS-COV-2 Vaccination 06/02/2019, 06/23/2019, 01/25/2020   Pfizer Covid-19 Vaccine Bivalent Booster 22yr & up 03/03/2021   Pneumococcal Conjugate-13 02/10/2000, 04/14/2000, 06/09/2000, 12/06/2001   Tdap 12/23/2009, 12/27/2017   Varicella 12/09/2000, 12/14/2005     Past Medical History:  Diagnosis Date  ADHD (attention deficit hyperactivity disorder)    Anosmia 05/17/2016   Anxiety 11/28/2014   Broken wrist 10/2005   right wrist   Concussion 10/23/2015   IUD (intrauterine device) in place 03/2019   Neonatal hyperbilirubinemia    peak bili 20.9   Post concussion syndrome 10/29/2015   Skull fracture ([place]) 10/2015   UTI (lower urinary tract infection) 02/21/12   cystitis, E. Coli   Allergies  Allergen Reactions    Gabapentin Rash   Past Surgical History:  Procedure Laterality Date   INTRAUTERINE DEVICE (IUD) INSERTION     kyleena inserted 03-29-2019   WISDOM TOOTH EXTRACTION     Family History  Problem Relation Age of Onset   Diabetes Father    Throat cancer Father    Hypertension Father    Diabetes Maternal Grandmother    Hypertension Maternal Grandmother    Miscarriages / Stillbirths Maternal Grandmother    Cancer Paternal Grandfather        leukemia   Diabetes Paternal Grandfather    Heart disease Paternal Grandfather        and renal disease-due to medications   Migraines Paternal Grandfather    Depression Mother    ADD / ADHD Sister    Autism Sister    Depression Sister    ADD / ADHD Brother    Learning disabilities Brother    Breast cancer Paternal Grandmother    Miscarriages / Stillbirths Paternal Grandmother    Social History   Social History Narrative      Safety:      -Wears a bicycle helmet riding a bike: Yes     -smoke alarm in the home:Yes     - wears seatbelt: Yes     - Feels safe in their relationships: Yes                   Allergies as of 09/30/2021       Reactions   Gabapentin Rash        Medication List        Accurate as of September 30, 2021  5:31 PM. If you have any questions, ask your nurse or doctor.          STOP taking these medications    cloNIDine 0.1 MG tablet Commonly known as: CATAPRES Stopped by: Howard Pouch, DO       TAKE these medications    amphetamine-dextroamphetamine 20 MG tablet Commonly known as: ADDERALL Take 0.5-1 tablets (10-20 mg total) by mouth 2 (two) times daily. Started by: Howard Pouch, DO   [name] 19.5 MG IUD Generic drug: levonorgestrel by Intrauterine route once.        All past medical history, surgical history, allergies, family history, immunizations andmedications were updated in the EMR today and reviewed under the history and medication portions of their EMR.     Recent Results  (from the past 2160 hour(s))  Comprehensive metabolic panel     Status: None   Collection Time: 09/30/21  1:31 PM  Result Value Ref Range   Sodium 139 135 - 145 mEq/L   Potassium 4.2 3.5 - 5.1 mEq/L   Chloride 103 96 - 112 mEq/L   CO2 26 19 - 32 mEq/L   Glucose, Bld 75 70 - 99 mg/dL   BUN 11 6 - 23 mg/dL   Creatinine, Ser 0.69 0.40 - 1.20 mg/dL   Total Bilirubin 0.5 0.2 - 1.2 mg/dL   Alkaline Phosphatase 102 39 - 117 U/L  AST 13 0 - 37 U/L   ALT 12 0 - 35 U/L   Total Protein 7.3 6.0 - 8.3 g/dL   Albumin 4.8 3.5 - 5.2 g/dL   GFR 123.71 >60.00 mL/min    Comment: Calculated using the CKD-EPI Creatinine Equation (2021)   Calcium 10.0 8.4 - 10.5 mg/dL  CBC     Status: Abnormal   Collection Time: 09/30/21  1:31 PM  Result Value Ref Range   WBC 12.3 (H) 4.0 - 10.5 K/uL   RBC 4.84 3.87 - 5.11 Mil/uL   Platelets 339.0 150.0 - 400.0 K/uL   Hemoglobin 14.6 12.0 - 15.0 g/dL   HCT 44.1 36.0 - 46.0 %   MCV 91.0 78.0 - 100.0 fl   MCHC 33.2 30.0 - 36.0 g/dL   RDW 12.8 11.5 - 15.5 %  Direct LDL     Status: None   Collection Time: 09/30/21  1:31 PM  Result Value Ref Range   Direct LDL 100.0 mg/dL    Comment: Optimal:  <100 mg/dLNear or Above Optimal:  100-129 mg/dLBorderline High:  130-159 mg/dLHigh:  160-189 mg/dLVery High:  >190 mg/dL    No results found.   ROS 14 pt review of systems performed and negative (unless mentioned in an HPI)  Objective: BP 108/68   Pulse 83   Temp 98.2 F (36.8 C) (Oral)   Ht 5' 5.5" (1.664 m)   Wt 171 lb (77.6 kg)   SpO2 96%   BMI 28.02 kg/m  Physical Exam Vitals and nursing note reviewed.  Constitutional:      General: She is not in acute distress.    Appearance: Normal appearance. She is not ill-appearing or toxic-appearing.  HENT:     Head: Normocephalic and atraumatic.     Right Ear: Tympanic membrane, ear canal and external ear normal. There is no impacted cerumen.     Left Ear: Tympanic membrane, ear canal and external ear normal.  There is no impacted cerumen.     Nose: No congestion or rhinorrhea.     Mouth/Throat:     Mouth: Mucous membranes are moist.     Pharynx: Oropharynx is clear. No oropharyngeal exudate or posterior oropharyngeal erythema.  Eyes:     General: No scleral icterus.       Right eye: No discharge.        Left eye: No discharge.     Extraocular Movements: Extraocular movements intact.     Conjunctiva/sclera: Conjunctivae normal.     Pupils: Pupils are equal, round, and reactive to light.  Cardiovascular:     Rate and Rhythm: Normal rate and regular rhythm.     Pulses: Normal pulses.     Heart sounds: Normal heart sounds. No murmur heard.    No friction rub. No gallop.  Pulmonary:     Effort: Pulmonary effort is normal. No respiratory distress.     Breath sounds: Normal breath sounds. No stridor. No wheezing, rhonchi or rales.  Chest:     Chest wall: No tenderness.  Abdominal:     General: Abdomen is flat. Bowel sounds are normal. There is no distension.     Palpations: Abdomen is soft. There is no mass.     Tenderness: There is no abdominal tenderness. There is no right CVA tenderness, left CVA tenderness, guarding or rebound.     Hernia: No hernia is present.  Musculoskeletal:        General: No swelling, tenderness or deformity. Normal range of motion.  Cervical back: Normal range of motion and neck supple. No rigidity or tenderness.     Right lower leg: No edema.     Left lower leg: No edema.  Lymphadenopathy:     Cervical: No cervical adenopathy.  Skin:    General: Skin is warm and dry.     Coloration: Skin is not jaundiced or pale.     Findings: No bruising, erythema, lesion or rash.  Neurological:     General: No focal deficit present.     Mental Status: She is alert and oriented to person, place, and time. Mental status is at baseline.     Cranial Nerves: No cranial nerve deficit.     Sensory: No sensory deficit.     Motor: No weakness.     Coordination: Coordination  normal.     Gait: Gait normal.     Deep Tendon Reflexes: Reflexes normal.  Psychiatric:        Mood and Affect: Mood normal.        Behavior: Behavior normal.        Thought Content: Thought content normal.        Judgment: Judgment normal.      No results found.  Assessment/plan: Jeralynn Vaquera Nuzum is a 21 y.o. female present for CPE  IUD (intrauterine device) in place/[place] term current use of hormonal contraceptive - Comprehensive metabolic panel - CBC - Direct LDL  Attention deficit hyperactivity disorder (ADHD), combined type Patient would like to restart her Adderall.  She is concerned all the upcoming changes with her move will cause her focus to lack again. [place] controlled substance database reviewed today and appropriate. Restart Adderall.  She had been described Adderall 20 mg XR in the past. We will restart short acting Adderall 20 mg in which she can taper.   Adderall 10-20 mg twice daily as needed.  Need for hepatitis C screening test - Hepatitis C Antibody Routine general medical examination at a health care facility Colonoscopy: No family history.  Routine screening at 59 completed  Mammogram: MGM with history of breast cancer.  Uncertain age of onset.  Would encourage her to discuss with her gynecologist if she should follow routine breast cancer screenings, dependent upon her grandmother's history. Likely - routine screening.  Cervical cancer screening: Patient has [name] IUD.  Pap completed 02/09/2021 by Dr. Ninfa Meeker Immunizations: tdap UTD 2019, Influenza UTD (encouraged yearly), patient has had COVID vaccines.  Discussed by valent booster. Infectious disease screening: HIV declined Hep C completed (tattoo), urine cytology/STD screen declined. Not SA DEXA: Routine screening Patient was encouraged to exercise greater than 150 minutes a week. Patient was encouraged to choose a diet filled with fresh fruits and vegetables, and lean meats. AVS provided  to patient today for education/recommendation on gender specific health and safety maintenance.  Pt moving to [place].    Orders Placed This Encounter  Procedures   Comprehensive metabolic panel   CBC   Hepatitis C Antibody   Direct LDL   Meds ordered this encounter  Medications   amphetamine-dextroamphetamine (ADDERALL) 20 MG tablet    Sig: Take 0.5-1 tablets (10-20 mg total) by mouth 2 (two) times daily.    Dispense:  60 tablet    Refill:  0   Referral Orders  No referral(s) requested today     Electronically signed by: Howard Pouch, [place]

## 2021-09-30 NOTE — Patient Instructions (Addendum)
No follow-ups on file.        Great to see you today.  I have refilled the medication(s) we provide.   If labs were collected, we will inform you of lab results once received either by echart message or telephone call.   - echart message- for normal results that have been seen by the patient already.   - telephone call: abnormal results or if patient has not viewed results in their echart.  Health Maintenance, Female Adopting a healthy lifestyle and getting preventive care are important in promoting health and wellness. Ask your health care provider about: The right schedule for you to have regular tests and exams. Things you can do on your own to prevent diseases and keep yourself healthy. What should I know about diet, weight, and exercise? Eat a healthy diet  Eat a diet that includes plenty of vegetables, fruits, low-fat dairy products, and lean protein. Do not eat a lot of foods that are high in solid fats, added sugars, or sodium. Maintain a healthy weight Body mass index (BMI) is used to identify weight problems. It estimates body fat based on height and weight. Your health care provider can help determine your BMI and help you achieve or maintain a healthy weight. Get regular exercise Get regular exercise. This is one of the most important things you can do for your health. Most adults should: Exercise for at least 150 minutes each week. The exercise should increase your heart rate and make you sweat (moderate-intensity exercise). Do strengthening exercises at least twice a week. This is in addition to the moderate-intensity exercise. Spend less time sitting. Even light physical activity can be beneficial. Watch cholesterol and blood lipids Have your blood tested for lipids and cholesterol at 22 years of age, then have this test every 5 years. Have your cholesterol levels checked more often if: Your lipid or cholesterol levels are high. You are older than 22 years of  age. You are at high risk for heart disease. What should I know about cancer screening? Depending on your health history and family history, you may need to have cancer screening at various ages. This may include screening for: Breast cancer. Cervical cancer. Colorectal cancer. Skin cancer. Lung cancer. What should I know about heart disease, diabetes, and high blood pressure? Blood pressure and heart disease High blood pressure causes heart disease and increases the risk of stroke. This is more likely to develop in people who have high blood pressure readings or are overweight. Have your blood pressure checked: Every 3-5 years if you are 18-39 years of age. Every year if you are 40 years old or older. Diabetes Have regular diabetes screenings. This checks your fasting blood sugar level. Have the screening done: Once every three years after age 40 if you are at a normal weight and have a low risk for diabetes. More often and at a younger age if you are overweight or have a high risk for diabetes. What should I know about preventing infection? Hepatitis B If you have a higher risk for hepatitis B, you should be screened for this virus. Talk with your health care provider to find out if you are at risk for hepatitis B infection. Hepatitis C Testing is recommended for: Everyone born from 1945 through 1965. Anyone with known risk factors for hepatitis C. Sexually transmitted infections (STIs) Get screened for STIs, including gonorrhea and chlamydia, if: You are sexually active and are younger than 22 years of age. You are   older than 22 years of age and your health care provider tells you that you are at risk for this type of infection. Your sexual activity has changed since you were last screened, and you are at increased risk for chlamydia or gonorrhea. Ask your health care provider if you are at risk. Ask your health care provider about whether you are at high risk for HIV. Your health  care provider may recommend a prescription medicine to help prevent HIV infection. If you choose to take medicine to prevent HIV, you should first get tested for HIV. You should then be tested every 3 months for as [place] as you are taking the medicine. Pregnancy If you are about to stop having your period (premenopausal) and you may become pregnant, seek counseling before you get pregnant. Take 400 to 800 micrograms (mcg) of folic acid every day if you become pregnant. Ask for birth control (contraception) if you want to prevent pregnancy. Osteoporosis and menopause Osteoporosis is a disease in which the bones lose minerals and strength with aging. This can result in bone fractures. If you are 65 years old or older, or if you are at risk for osteoporosis and fractures, ask your health care provider if you should: Be screened for bone loss. Take a calcium or vitamin D supplement to lower your risk of fractures. Be given hormone replacement therapy (HRT) to treat symptoms of menopause. Follow these instructions at home: Alcohol use Do not drink alcohol if: Your health care provider tells you not to drink. You are pregnant, may be pregnant, or are planning to become pregnant. If you drink alcohol: Limit how much you have to: 0-1 drink a day. Know how much alcohol is in your drink. In the [place] one drink equals one 12 oz bottle of beer (355 mL), one 5 oz glass of wine (148 mL), or one 1 oz glass of hard liquor (44 mL). Lifestyle Do not use any products that contain nicotine or tobacco. These products include cigarettes, chewing tobacco, and vaping devices, such as e-cigarettes. If you need help quitting, ask your health care provider. Do not use street drugs. Do not share needles. Ask your health care provider for help if you need support or information about quitting drugs. General instructions Schedule regular health, dental, and eye exams. Stay current with your vaccines. Tell your health  care provider if: You often feel depressed. You have ever been abused or do not feel safe at home. Summary Adopting a healthy lifestyle and getting preventive care are important in promoting health and wellness. Follow your health care provider's instructions about healthy diet, exercising, and getting tested or screened for diseases. Follow your health care provider's instructions on monitoring your cholesterol and blood pressure. This information is not intended to replace advice given to you by your health care provider. Make sure you discuss any questions you have with your health care provider. Document Revised: 07/28/2020 Document Reviewed: 07/28/2020 Elsevier Patient Education  2023 [employer]  

## 2021-10-01 LAB — HEPATITIS C ANTIBODY: Hepatitis C Ab: NONREACTIVE

## 2023-04-03 DIAGNOSIS — Z419 Encounter for procedure for purposes other than remedying health state, unspecified: Secondary | ICD-10-CM | POA: Diagnosis not present

## 2023-04-23 DIAGNOSIS — Z419 Encounter for procedure for purposes other than remedying health state, unspecified: Secondary | ICD-10-CM | POA: Diagnosis not present

## 2023-05-21 DIAGNOSIS — Z419 Encounter for procedure for purposes other than remedying health state, unspecified: Secondary | ICD-10-CM | POA: Diagnosis not present

## 2023-07-02 DIAGNOSIS — Z419 Encounter for procedure for purposes other than remedying health state, unspecified: Secondary | ICD-10-CM | POA: Diagnosis not present

## 2023-07-12 ENCOUNTER — Telehealth: Payer: Self-pay

## 2023-07-12 NOTE — Telephone Encounter (Signed)
 LV for pt to confirm appt need. Appears that it is too early for her physical (last physical 07/26/2022) pt will need to be rescheduled if wanting physical or we can see her for medication management.

## 2023-07-15 ENCOUNTER — Ambulatory Visit (INDEPENDENT_AMBULATORY_CARE_PROVIDER_SITE_OTHER): Admitting: Family Medicine

## 2023-07-15 ENCOUNTER — Encounter: Payer: Self-pay | Admitting: Family Medicine

## 2023-07-15 VITALS — BP 110/76 | HR 83 | Temp 98.1°F | Ht 65.5 in | Wt 171.4 lb

## 2023-07-15 DIAGNOSIS — F902 Attention-deficit hyperactivity disorder, combined type: Secondary | ICD-10-CM | POA: Diagnosis not present

## 2023-07-15 MED ORDER — AMPHETAMINE-DEXTROAMPHETAMINE 20 MG PO TABS: 20.0000 mg | ORAL_TABLET | Freq: Every day | ORAL | 0 refills | Status: DC
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
## 2023-07-15 NOTE — Patient Instructions (Signed)

## 2023-07-18 ENCOUNTER — Encounter: Payer: Self-pay | Admitting: Family Medicine

## 2023-07-22 MED ORDER — AMPHETAMINE-DEXTROAMPHETAMINE 20 MG PO TABS: 20.0000 mg | ORAL_TABLET | Freq: Every day | ORAL | 0 refills | Status: DC

## 2023-07-22 NOTE — Telephone Encounter (Signed)
 30d scripts provided.

## 2023-08-01 DIAGNOSIS — Z419 Encounter for procedure for purposes other than remedying health state, unspecified: Secondary | ICD-10-CM | POA: Diagnosis not present

## 2023-09-01 DIAGNOSIS — Z419 Encounter for procedure for purposes other than remedying health state, unspecified: Secondary | ICD-10-CM | POA: Diagnosis not present

## 2023-09-07 ENCOUNTER — Ambulatory Visit (INDEPENDENT_AMBULATORY_CARE_PROVIDER_SITE_OTHER): Admitting: Family Medicine

## 2023-09-07 ENCOUNTER — Encounter: Payer: Self-pay | Admitting: Family Medicine

## 2023-09-07 VITALS — BP 118/72 | HR 95 | Temp 98.2°F | Wt 168.0 lb

## 2023-09-07 DIAGNOSIS — W57XXXA Bitten or stung by nonvenomous insect and other nonvenomous arthropods, initial encounter: Secondary | ICD-10-CM | POA: Diagnosis not present

## 2023-09-07 DIAGNOSIS — R238 Other skin changes: Secondary | ICD-10-CM

## 2023-09-07 DIAGNOSIS — S40262A Insect bite (nonvenomous) of left shoulder, initial encounter: Secondary | ICD-10-CM | POA: Diagnosis not present

## 2023-09-07 MED ORDER — DOXYCYCLINE HYCLATE 100 MG PO TABS: 100.0000 mg | ORAL_TABLET | Freq: Two times a day (BID) | ORAL | 0 refills | Status: DC

## 2023-09-07 NOTE — Addendum Note (Signed)
 Addended by: Napolean Backbone A on: 09/07/2023 01:09 PM   Modules accepted: Orders
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
## 2023-09-12 ENCOUNTER — Ambulatory Visit: Payer: Self-pay | Admitting: Family Medicine

## 2023-09-12 LAB — ROCKY MTN SPOTTED FVR ABS PNL(IGG+IGM)
RMSF IgG: NOT DETECTED
RMSF IgM: NOT DETECTED

## 2023-09-12 LAB — B. BURGDORFI ANTIBODIES: B burgdorferi Ab IgG+IgM: <0.9 {index}

## 2023-09-13 ENCOUNTER — Telehealth: Payer: Self-pay

## 2023-09-13 NOTE — Telephone Encounter (Signed)
 Reason for CRM: Patient is needing her immunization records for classes that she enrolled in. Patient would like a callback to [phone] when these records are ready to be picked up.

## 2023-09-13 NOTE — Telephone Encounter (Signed)
 Spoke with patient regarding results/recommendations.

## 2023-10-01 DIAGNOSIS — Z419 Encounter for procedure for purposes other than remedying health state, unspecified: Secondary | ICD-10-CM | POA: Diagnosis not present

## 2023-11-01 ENCOUNTER — Encounter: Payer: Self-pay | Admitting: Family Medicine

## 2023-11-01 ENCOUNTER — Other Ambulatory Visit (HOSPITAL_COMMUNITY)
Admission: RE | Admit: 2023-11-01 | Discharge: 2023-11-01 | Disposition: A | Discharge status: Home / Self Care | Source: Ambulatory Visit | Attending: Family Medicine | Admitting: Family Medicine

## 2023-11-01 ENCOUNTER — Ambulatory Visit (INDEPENDENT_AMBULATORY_CARE_PROVIDER_SITE_OTHER): Admitting: Family Medicine

## 2023-11-01 VITALS — BP 112/74 | HR 96 | Temp 98.1°F | Ht 66.0 in | Wt 167.4 lb

## 2023-11-01 DIAGNOSIS — Z419 Encounter for procedure for purposes other than remedying health state, unspecified: Secondary | ICD-10-CM | POA: Diagnosis not present

## 2023-11-01 DIAGNOSIS — Z113 Encounter for screening for infections with a predominantly sexual mode of transmission: Secondary | ICD-10-CM

## 2023-11-01 DIAGNOSIS — F902 Attention-deficit hyperactivity disorder, combined type: Secondary | ICD-10-CM

## 2023-11-01 DIAGNOSIS — Z975 Presence of (intrauterine) contraceptive device: Secondary | ICD-10-CM

## 2023-11-01 DIAGNOSIS — Z793 Long term (current) use of hormonal contraceptives: Secondary | ICD-10-CM

## 2023-11-01 DIAGNOSIS — L219 Seborrheic dermatitis, unspecified: Secondary | ICD-10-CM | POA: Diagnosis not present

## 2023-11-01 DIAGNOSIS — Z0000 Encounter for general adult medical examination without abnormal findings: Secondary | ICD-10-CM | POA: Diagnosis not present

## 2023-11-01 LAB — COMPREHENSIVE METABOLIC PANEL WITH GFR
ALT: 12 U/L (ref 0–35)
AST: 13 U/L (ref 0–37)
Albumin: 4.4 g/dL (ref 3.5–5.2)
Alkaline Phosphatase: 85 U/L (ref 39–117)
BUN: 13 mg/dL (ref 6–23)
CO2: 28 meq/L (ref 19–32)
Calcium: 9.2 mg/dL (ref 8.4–10.5)
Chloride: 104 meq/L (ref 96–112)
Creatinine, Ser: 0.58 mg/dL (ref 0.40–1.20)
GFR: 127.13 mL/min (ref >60.00)
Glucose, Bld: 82 mg/dL (ref 70–99)
Potassium: 4.1 meq/L (ref 3.5–5.1)
Sodium: 140 meq/L (ref 135–145)
Total Bilirubin: 0.5 mg/dL (ref 0.2–1.2)
Total Protein: 6.8 g/dL (ref 6.0–8.3)

## 2023-11-01 LAB — LIPID PANEL
Cholesterol: 157 mg/dL (ref 0–200)
HDL: 58.7 mg/dL (ref >39.00)
LDL Cholesterol: 85 mg/dL (ref 0–99)
NonHDL: 97.93
Total CHOL/HDL Ratio: 3
Triglycerides: 63 mg/dL (ref 0.0–149.0)
VLDL: 12.6 mg/dL (ref 0.0–40.0)

## 2023-11-01 LAB — CBC
HCT: 43.5 % (ref 36.0–46.0)
Hemoglobin: 14.4 g/dL (ref 12.0–15.0)
MCHC: 33 g/dL (ref 30.0–36.0)
MCV: 90.6 fl (ref 78.0–100.0)
Platelets: 359 K/uL (ref 150.0–400.0)
RBC: 4.8 Mil/uL (ref 3.87–5.11)
RDW: 13.2 % (ref 11.5–15.5)
WBC: 9.1 K/uL (ref 4.0–10.5)

## 2023-11-01 LAB — TSH: TSH: 1.04 u[IU]/mL (ref 0.35–5.50)

## 2023-11-01 MED ORDER — AMPHETAMINE-DEXTROAMPHETAMINE 20 MG PO TABS: 20.0000 mg | ORAL_TABLET | Freq: Every day | ORAL | 0 refills | Status: DC

## 2023-11-01 MED ORDER — KETOCONAZOLE 1 % EX SHAM: MEDICATED_SHAMPOO | CUTANEOUS | 5 refills | Status: AC

## 2023-11-01 NOTE — Patient Instructions (Signed)
 Return in about 14 weeks (around 02/07/2024) for Routine chronic condition follow-up.        Great to see you today.  I have refilled the medication(s) we provide.   If labs were collected or images ordered, we will inform you of  results once we have received them and reviewed. We will contact you either by echart message, or telephone call.  Please give ample time to the testing facility, and our office to run,  receive and review results. Please do not call inquiring of results, even if you can see them in your chart. We will contact you as soon as we are able. If it has been over 1 week since the test was completed, and you have not yet heard from us , then please call us .    - echart message- for normal results that have been seen by the patient already.   - telephone call: abnormal results or if patient has not viewed results in their echart.  If a referral to a specialist was entered for you, please call us  in 2 weeks if you have not heard from the specialist office to schedule.
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
## 2023-11-02 ENCOUNTER — Ambulatory Visit: Payer: Self-pay | Admitting: Family Medicine

## 2023-11-02 ENCOUNTER — Encounter: Payer: Self-pay | Admitting: Family Medicine

## 2023-11-02 LAB — URINE CYTOLOGY ANCILLARY ONLY
Chlamydia: NEGATIVE
Comment: NEGATIVE
Comment: NORMAL
Neisseria Gonorrhea: NEGATIVE

## 2023-11-02 LAB — HIV ANTIBODY (ROUTINE TESTING W REFLEX): HIV 1&2 Ab, 4th Generation: NONREACTIVE

## 2023-11-02 NOTE — Telephone Encounter (Signed)
 No further action needed at this time.

## 2023-12-02 DIAGNOSIS — Z419 Encounter for procedure for purposes other than remedying health state, unspecified: Secondary | ICD-10-CM | POA: Diagnosis not present

## 2024-02-01 DIAGNOSIS — Z419 Encounter for procedure for purposes other than remedying health state, unspecified: Secondary | ICD-10-CM | POA: Diagnosis not present

## 2024-02-07 ENCOUNTER — Ambulatory Visit (INDEPENDENT_AMBULATORY_CARE_PROVIDER_SITE_OTHER): Admitting: Family Medicine

## 2024-02-07 VITALS — BP 120/68 | HR 79 | Wt 166.8 lb

## 2024-02-07 DIAGNOSIS — Z23 Encounter for immunization: Secondary | ICD-10-CM

## 2024-02-07 DIAGNOSIS — F902 Attention-deficit hyperactivity disorder, combined type: Secondary | ICD-10-CM | POA: Diagnosis not present

## 2024-02-07 MED ORDER — AMPHETAMINE-DEXTROAMPHETAMINE 20 MG PO TABS: 20.0000 mg | ORAL_TABLET | Freq: Every day | ORAL | 0 refills | Status: AC

## 2024-02-07 NOTE — Progress Notes (Signed)
 Patient ID: Hannah Alvarado, female  DOB: [birth year], 23 y.o.   MRN: [id number] Patient Care Team    Relationship Specialty Notifications Start End  Catherine Charlies LABOR, DO PCP - General Family Medicine  07/12/23   Waddell Corean HERO, MD Consulting Physician Pediatric Neurology  02/02/17   Cleotilde Ronal RAMAN, MD Consulting Physician Gynecology  04/02/19     Chief Complaint  Patient presents with   ADHD    Subjective:  Hannah Alvarado is a 23 y.o.  female present for OV for Chronic Conditions/illness Management Medication reconciliation completed today All past medical history updated where appropriate.  ADHD/anxiety/sleep disorder: pt reports compliance with Adderall 20 mg every day.  Medication is still working well for her.  No negative side effects.  No complaints.      02/07/2024    8:09 AM 11/01/2023    8:13 AM 07/15/2023    8:02 AM 09/30/2021    1:07 PM 02/09/2021    9:42 AM  Depression screen PHQ 2/9  Decreased Interest 0 0 0 0 0  Down, Depressed, Hopeless 0 0 0 0 0  PHQ - 2 Score 0 0 0 0 0  Altered sleeping  0 0    Tired, decreased energy  0 0    Change in appetite  1 0    Feeling bad or failure about yourself   0 0    Trouble concentrating  0 0    Moving slowly or fidgety/restless  0 0    Suicidal thoughts  0 0    PHQ-9 Score  1  0     Difficult doing work/chores  Not difficult at all Not difficult at all       Data saved with a previous flowsheet row definition      11/01/2023    8:13 AM 07/15/2023    8:02 AM  GAD 7 : Generalized Anxiety Score  Nervous, Anxious, on Edge 0 0  Control/stop worrying 0 0  Worry too much - different things 0 0  Trouble relaxing 0 0  Restless 0 0  Easily annoyed or irritable 0 0  Afraid - awful might happen 0 0  Total GAD 7 Score 0 0  Anxiety Difficulty Not difficult at all Not difficult at all          11/01/2023    8:13 AM 07/15/2023    8:01 AM  Fall Risk   Falls in the past year? 0 0  Follow up Falls  evaluation completed Falls evaluation completed    Immunization History  Administered Date(s) Administered   DTaP 02/10/2000, 04/14/2000, 06/09/2000, 03/03/2001, 11/03/2004   Fluad Quad(high Dose 65+) 11/23/2018   HIB (PRP-OMP) 02/10/2000, 04/14/2000, 06/09/2000, 03/03/2001   HPV Quadrivalent 12/08/2012, 02/08/2013, 12/04/2013   Hepatitis A 11/03/2004, 12/14/2005   Hepatitis B 1999/08/10, 02/10/2000, 09/15/2000   IPV 02/10/2000, 04/14/2000, 09/15/2000, 11/03/2004   Influenza Nasal 12/13/2007, 11/19/2008, 12/23/2009, 12/21/2010, 11/24/2011   Influenza, Seasonal, Injecte, Preservative Fre 02/07/2024   Influenza,Quad,Nasal, Live 12/08/2012   Influenza,inj,Quad PF,6+ Mos 11/25/2015, 11/27/2015, 12/28/2016, 12/27/2017, 11/23/2018, 12/12/2019   Influenza,inj,quad, With Preservative 01/02/2014, 12/06/2014   Influenza-Unspecified 01/26/2021   MMR 12/09/2000, 11/03/2004   Meningococcal B, OMV 07/28/2017, 08/30/2017   Meningococcal Conjugate 12/21/2010, 12/26/2015   PFIZER(Purple Top)SARS-COV-2 Vaccination 06/02/2019, 06/23/2019, 01/25/2020   Pfizer Covid-19 Vaccine Bivalent Booster 30yrs & up 03/03/2021   Pneumococcal Conjugate-13 02/10/2000, 04/14/2000, 06/09/2000, 12/06/2001   Tdap 12/23/2009, 12/27/2017   Varicella 12/09/2000, 12/14/2005    No results found.  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 Coli   Allergies  Allergen Reactions   Gabapentin  Rash   Past Surgical History:  Procedure Laterality Date   INTRAUTERINE DEVICE (IUD) INSERTION      kyleena  inserted 03-29-2019   WISDOM TOOTH EXTRACTION     Family History  Problem Relation Age of Onset   Diabetes Father    Throat cancer Father    Hypertension Father    Diabetes Maternal Grandmother    Hypertension Maternal Grandmother    Miscarriages / Stillbirths Maternal Grandmother    Cancer Paternal Grandfather        leukemia   Diabetes Paternal Grandfather    Heart disease Paternal Grandfather        and renal disease-due to medications   Migraines Paternal Grandfather    Depression Mother    ADD / ADHD Sister    Autism Sister    Depression Sister    ADD / ADHD Brother    Learning disabilities Brother    Breast cancer Paternal Grandmother    Miscarriages / Stillbirths Paternal Grandmother    Social History   Social History Narrative      Safety:      -Wears a bicycle helmet riding a bike: Yes     -smoke alarm in the home:Yes     - wears seatbelt: Yes     - Feels safe in their relationships: Yes                   Allergies as of 02/07/2024       Reactions   Gabapentin  Rash        Medication List        Accurate as of February 07, 2024  9:47 AM. If you have any questions, ask your nurse or doctor.          amphetamine -dextroamphetamine  20 MG tablet Commonly known as: ADDERALL Take 1 tablet (20 mg total) by mouth daily. What changed:  Another medication with the same name was changed. Make sure you understand how and when to take each. Another medication with the same name was removed. Continue taking this medication, and follow the directions you see here. Changed by: Charlies Bellini   amphetamine -dextroamphetamine  20 MG tablet Commonly known as: ADDERALL Take 1 tablet (20 mg total) by mouth daily. Start taking on: March 01, 2024 What changed:  These instructions start on March 01, 2024. If you are unsure what to do until then, ask your doctor or other care provider. Another medication with the same name was removed. Continue taking  this medication, and follow the directions you see here. Changed by: Charlies Bellini   amphetamine -dextroamphetamine  20 MG tablet Commonly known as: ADDERALL Take 1 tablet (20 mg total) by mouth daily. Start taking on: March 23, 2024 What changed:  These instructions start on March 23, 2024. If you are unsure what to do until then, ask your doctor or other care provider. Another medication with the same name was removed. Continue taking this medication, and  follow the directions you see here. Changed by: Charlies Bellini   amphetamine -dextroamphetamine  20 MG tablet Commonly known as: ADDERALL Take 1 tablet (20 mg total) by mouth daily. Start taking on: April 14, 2024 What changed:  These instructions start on April 14, 2024. If you are unsure what to do until then, ask your doctor or other care provider. Another medication with the same name was removed. Continue taking this medication, and follow the directions you see here. Changed by: Charlies Bellini   KETOCONAZOLE  (TOPICAL) 1 % Sham Apply 5-10 ml of shampoo to the head and lather through hair 3 times weekly for 4 weeks.  Leave shampoo on for 3 to 5 minutes then rinse completely.  After 4 weeks can use once weekly for maintenance.   [name]  19.5 MG IUD Generic drug: levonorgestrel  by Intrauterine route once.        All past medical history, surgical history, allergies, family history, immunizations andmedications were updated in the EMR today and reviewed under the history and medication portions of their EMR.      ROS: 14 pt review of systems performed and negative (unless mentioned in an HPI)  Objective: BP 120/68   Pulse 79   Wt 166 lb 12.8 oz (75.7 kg)   SpO2 96%   BMI 26.92 kg/m  Physical Exam Vitals and nursing note reviewed.  Constitutional:      General: She is not in acute distress.    Appearance: Normal appearance. She is normal weight. She is not ill-appearing or toxic-appearing.  HENT:     Head:  Normocephalic and atraumatic.  Eyes:     General: No scleral icterus.       Right eye: No discharge.        Left eye: No discharge.     Extraocular Movements: Extraocular movements intact.     Conjunctiva/sclera: Conjunctivae normal.     Pupils: Pupils are equal, round, and reactive to light.  Skin:    Findings: No rash.  Neurological:     Mental Status: She is alert and oriented to person, place, and time. Mental status is at baseline.     Motor: No weakness.     Coordination: Coordination normal.     Gait: Gait normal.  Psychiatric:        Mood and Affect: Mood normal.        Behavior: Behavior normal.        Thought Content: Thought content normal.        Judgment: Judgment normal.     Assessment/plan: Hannah Alvarado is a 23 y.o. female present for Chronic Conditions/illness Management Attention deficit hyperactivity disorder (ADHD), combined type Stable Continue adderall 20 mg every day.  #30, x 4 [place]  controlled substance database reviewed today and appropriate. Follow-up in 3-4 months when needing refills  Influenza vaccine: given today  Meds ordered this encounter  Medications   amphetamine -dextroamphetamine  (ADDERALL) 20 MG tablet    Sig: Take 1 tablet (20 mg total) by mouth daily.    Dispense:  30 tablet    Refill:  0   amphetamine -dextroamphetamine  (ADDERALL) 20 MG tablet    Sig: Take 1 tablet (20 mg total) by mouth daily.    Dispense:  30 tablet    Refill:  0   amphetamine -dextroamphetamine  (ADDERALL) 20 MG tablet    Sig: Take 1 tablet (20 mg total) by mouth daily.    Dispense:  30 tablet    Refill:  0   amphetamine -dextroamphetamine  (ADDERALL) 20 MG  tablet    Sig: Take 1 tablet (20 mg total) by mouth daily.    Dispense:  30 tablet    Refill:  0    Note is dictated utilizing voice recognition software. Although note has been proof read prior to signing, occasional typographical errors still can be missed. If any questions arise, please  do not hesitate to call for verification.  Electronically signed by: Charlies Bellini, DO [facility] [place]

## 2024-02-07 NOTE — Patient Instructions (Addendum)
 Return in about 15 weeks (around 05/22/2024) for Routine chronic condition follow-up.        Great to see you today.  I have refilled the medication(s) we provide.   If labs were collected or images ordered, we will inform you of  results once we have received them and reviewed. We will contact you either by echart message, or telephone call.  Please give ample time to the testing facility, and our office to run,  receive and review results. Please do not call inquiring of results, even if you can see them in your chart. We will contact you as soon as we are able. If it has been over 1 week since the test was completed, and you have not yet heard from us , then please call us .    - echart message- for normal results that have been seen by the patient already.   - telephone call: abnormal results or if patient has not viewed results in their echart.  If a referral to a specialist was entered for you, please call us  in 2 weeks if you have not heard from the specialist office to schedule.

## 2024-03-28 NOTE — Progress Notes (Signed)
 "  ANNUAL EXAM Patient name: Hannah Alvarado MRN [id number]  Date of birth: Dec 30, 2000 Chief Complaint:   Gynecologic Exam  History of Present Illness:   Hannah Alvarado is a 24 y.o. G0P0000 Caucasian female being seen today for a routine annual exam.  Has [name]  IUD needs removal and replacement today.  Has regular cycles and they last 5 -7 days.  Not STI screening indicated at this time.     LMP: 03/05/2024   Last pap: 02/09/2021. Results were: NILM w/ HRHPV not done. H/O abnormal pap: no Last mammogram:  Family h/o breast cancer: yes paternal grandmother Last colonoscopy: Family h/o colorectal cancer: no     03/29/2024    3:08 PM 02/07/2024    8:09 AM 11/01/2023    8:13 AM 07/15/2023    8:02 AM 09/30/2021    1:07 PM  Depression screen PHQ 2/9  Decreased Interest 0 0 0 0 0  Down, Depressed, Hopeless 0 0 0 0 0  PHQ - 2 Score 0 0 0 0 0  Altered sleeping   0 0   Tired, decreased energy   0 0   Change in appetite   1 0   Feeling bad or failure about yourself    0 0   Trouble concentrating   0 0   Moving slowly or fidgety/restless   0 0   Suicidal thoughts   0 0   PHQ-9 Score   1  0    Difficult doing work/chores   Not difficult at all Not difficult at all      Data saved with a previous flowsheet row definition        11/01/2023    8:13 AM 07/15/2023    8:02 AM  GAD 7 : Generalized Anxiety Score  Nervous, Anxious, on Edge 0 0  Control/stop worrying 0 0  Worry too much - different things 0 0  Trouble relaxing 0 0  Restless 0 0  Easily annoyed or irritable 0 0  Afraid - awful might happen 0 0  Total GAD 7 Score 0 0  Anxiety Difficulty Not difficult at all Not difficult at all     Review of Systems:   Pertinent items are noted in HPI Denies any urinary or bowel changes.  Denies pelvic pain.   Pertinent History Reviewed:  Reviewed past medical,surgical, social and family history.  Reviewed problem list, medications and allergies. Physical Assessment:    Vitals:   03/29/24 1504  BP: 119/88  Pulse: (!) 110  SpO2: 100%  Weight: 168 lb 3.2 oz (76.3 kg)  Height: 5' 5.5 (1.664 m)  Body mass index is 27.56 kg/m.        Physical Examination:   General appearance - well appearing, and in no distress  Mental status - alert, oriented to person, place, and time  Psych:  She has a normal mood and affect  Skin - warm and dry, normal color, no suspicious lesions noted  Chest - effort normal, all lung fields clear to auscultation bilaterally  Heart - normal rate and regular rhythm  Neck:  midline trachea, no thyromegaly or nodules  Breasts - breasts appear normal, no suspicious masses, no skin or nipple changes or  axillary nodes  Abdomen - soft, nontender, nondistended, no masses or organomegaly  Pelvic - VULVA: normal appearing vulva with no masses, tenderness or lesions   VAGINA: normal appearing vagina with normal color and discharge, no lesions   CERVIX: normal appearing cervix without discharge  or lesions, no CMT  Thin prep pap is updated today without HR HPV testing  Procedure:  IUD removed with one pull of IUD string.  Pt visualized before discarding.  Tenaculum placed on anterior lip of cervix.  Uterus sounded to 7cm.  [name]  and introducer pass to fundus and then withdrawn slightly.  IUD released and introducer removed.  Strings cut to 2cm.  Pt tolerated procedure well.  Speculum removed.    UTERUS: uterus is felt to be normal size, shape, consistency and nontender   ADNEXA: No adnexal masses or tenderness noted.  Rectal - deferred  Extremities:  No swelling or varicosities noted  Chaperone present for exam  No results found for this or any previous visit (from the past 24 hours).  Assessment & Plan:  1. Well woman exam with routine gynecological exam (Primary) - Pap smear updated today - Mammogram guidelines reviewed - Colonoscopy guidelines reviewed - lab work done with PCP, Dr. Catherine - vaccines reviewed/updated  2.  Cervical cancer screening - Cytology - PAP( [place])  3. Attention deficit hyperactivity disorder (ADHD), combined type  4. IUD (intrauterine device) in place - [name]  removed and replaced today - recheck 6 weeks   No orders of the defined types were placed in this encounter.   Meds:  Meds ordered this encounter  Medications   levonorgestrel  ([name] ) 19.5 MG IUD    Follow-up: No follow-ups on file.  Ronal GORMAN [place], MD 03/29/2024 3:53 PM "

## 2024-03-29 ENCOUNTER — Ambulatory Visit (HOSPITAL_BASED_OUTPATIENT_CLINIC_OR_DEPARTMENT_OTHER): Admitting: Obstetrics & Gynecology

## 2024-03-29 ENCOUNTER — Other Ambulatory Visit (HOSPITAL_COMMUNITY)
Admission: RE | Admit: 2024-03-29 | Discharge: 2024-03-29 | Disposition: A | Source: Ambulatory Visit | Attending: Obstetrics & Gynecology | Admitting: Obstetrics & Gynecology

## 2024-03-29 ENCOUNTER — Encounter (HOSPITAL_BASED_OUTPATIENT_CLINIC_OR_DEPARTMENT_OTHER): Payer: Self-pay | Admitting: Obstetrics & Gynecology

## 2024-03-29 VITALS — BP 119/88 | HR 110 | Ht 65.5 in | Wt 168.2 lb

## 2024-03-29 DIAGNOSIS — Z975 Presence of (intrauterine) contraceptive device: Secondary | ICD-10-CM

## 2024-03-29 DIAGNOSIS — Z124 Encounter for screening for malignant neoplasm of cervix: Secondary | ICD-10-CM

## 2024-03-29 DIAGNOSIS — F902 Attention-deficit hyperactivity disorder, combined type: Secondary | ICD-10-CM

## 2024-03-29 DIAGNOSIS — Z01419 Encounter for gynecological examination (general) (routine) without abnormal findings: Secondary | ICD-10-CM

## 2024-03-29 MED ORDER — LEVONORGESTREL 19.5 MG IU IUD: INTRAUTERINE_SYSTEM | Freq: Once | INTRAUTERINE | Status: AC

## 2024-03-30 ENCOUNTER — Ambulatory Visit (HOSPITAL_BASED_OUTPATIENT_CLINIC_OR_DEPARTMENT_OTHER): Payer: Self-pay | Admitting: Obstetrics & Gynecology

## 2024-03-30 LAB — CYTOLOGY - PAP: Diagnosis: NEGATIVE

## 2024-04-23 ENCOUNTER — Ambulatory Visit: Admitting: Dermatology

## 2024-05-11 ENCOUNTER — Ambulatory Visit: Admitting: Dermatology

## 2024-05-15 ENCOUNTER — Ambulatory Visit (HOSPITAL_BASED_OUTPATIENT_CLINIC_OR_DEPARTMENT_OTHER): Payer: Self-pay | Admitting: Obstetrics & Gynecology

## 2024-05-22 ENCOUNTER — Ambulatory Visit: Admitting: Family Medicine
